# Patient Record
Sex: Male | Born: 1995 | Race: White | Hispanic: Yes | Marital: Single | Smoking: Never smoker
Health system: Southern US, Community
[De-identification: ages and names within clinical notes are randomized; demographics above are authoritative.]

## PROBLEM LIST (undated history)

## (undated) HISTORY — PX: KNEE SURGERY: SHX244

## (undated) HISTORY — PX: OTHER SURGICAL HISTORY: SHX169

---
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
## 2015-08-12 ENCOUNTER — Emergency Department (HOSPITAL_COMMUNITY): Payer: BLUE CROSS/BLUE SHIELD

## 2015-08-12 ENCOUNTER — Inpatient Hospital Stay (HOSPITAL_COMMUNITY): Payer: BLUE CROSS/BLUE SHIELD

## 2015-08-12 ENCOUNTER — Encounter (HOSPITAL_COMMUNITY): Payer: Self-pay | Admitting: Emergency Medicine

## 2015-08-12 ENCOUNTER — Inpatient Hospital Stay (HOSPITAL_COMMUNITY)
Admission: EM | Admit: 2015-08-12 | Discharge: 2015-08-19 | DRG: 988 | Disposition: A | Discharge status: Home / Self Care | Payer: BLUE CROSS/BLUE SHIELD | Attending: General Surgery | Admitting: General Surgery

## 2015-08-12 DIAGNOSIS — R509 Fever, unspecified: Secondary | ICD-10-CM | POA: Diagnosis not present

## 2015-08-12 DIAGNOSIS — R109 Unspecified abdominal pain: Secondary | ICD-10-CM | POA: Diagnosis present

## 2015-08-12 DIAGNOSIS — S36113A Laceration of liver, unspecified degree, initial encounter: Secondary | ICD-10-CM | POA: Diagnosis present

## 2015-08-12 DIAGNOSIS — D62 Acute posthemorrhagic anemia: Secondary | ICD-10-CM | POA: Diagnosis not present

## 2015-08-12 DIAGNOSIS — J90 Pleural effusion, not elsewhere classified: Secondary | ICD-10-CM

## 2015-08-12 DIAGNOSIS — S31109A Unspecified open wound of abdominal wall, unspecified quadrant without penetration into peritoneal cavity, initial encounter: Secondary | ICD-10-CM

## 2015-08-12 DIAGNOSIS — R079 Chest pain, unspecified: Secondary | ICD-10-CM

## 2015-08-12 DIAGNOSIS — S36116A Major laceration of liver, initial encounter: Secondary | ICD-10-CM

## 2015-08-12 LAB — TYPE AND SCREEN
ABO/RH(D): B POS
Antibody Screen: NEGATIVE

## 2015-08-12 LAB — I-STAT TROPONIN, ED: TROPONIN I, POC: 0 ng/mL (ref 0.00–0.08)

## 2015-08-12 LAB — BASIC METABOLIC PANEL
Anion gap: 11 (ref 5–15)
BUN: 12 mg/dL (ref 6–20)
CALCIUM: 8.7 mg/dL — AB (ref 8.9–10.3)
CO2: 25 mmol/L (ref 22–32)
CREATININE: 1.11 mg/dL (ref 0.61–1.24)
Chloride: 94 mmol/L — ABNORMAL LOW (ref 101–111)
GFR calc Af Amer: >60 mL/min (ref >60)
GLUCOSE: 146 mg/dL — AB (ref 65–99)
Potassium: 3.8 mmol/L (ref 3.5–5.1)
Sodium: 130 mmol/L — ABNORMAL LOW (ref 135–145)

## 2015-08-12 LAB — CBC
HCT: 32.9 % — ABNORMAL LOW (ref 39.0–52.0)
HEMATOCRIT: 28.8 % — AB (ref 39.0–52.0)
HEMOGLOBIN: 10.1 g/dL — AB (ref 13.0–17.0)
Hemoglobin: 11.7 g/dL — ABNORMAL LOW (ref 13.0–17.0)
MCH: 32.3 pg (ref 26.0–34.0)
MCH: 32.1 pg (ref 26.0–34.0)
MCHC: 35.6 g/dL (ref 30.0–36.0)
MCHC: 35.1 g/dL (ref 30.0–36.0)
MCV: 90.9 fL (ref 78.0–100.0)
MCV: 91.4 fL (ref 78.0–100.0)
Platelets: 198 10*3/uL (ref 150–400)
Platelets: 162 10*3/uL (ref 150–400)
RBC: 3.62 MIL/uL — ABNORMAL LOW (ref 4.22–5.81)
RBC: 3.15 MIL/uL — AB (ref 4.22–5.81)
RDW: 12.6 % (ref 11.5–15.5)
RDW: 12.7 % (ref 11.5–15.5)
WBC: 12.1 10*3/uL — ABNORMAL HIGH (ref 4.0–10.5)
WBC: 10.1 10*3/uL (ref 4.0–10.5)

## 2015-08-12 LAB — HEMOGLOBIN AND HEMATOCRIT, BLOOD
HCT: 28.1 % — ABNORMAL LOW (ref 39.0–52.0)
HCT: 28.3 % — ABNORMAL LOW (ref 39.0–52.0)
HCT: 27.5 % — ABNORMAL LOW (ref 39.0–52.0)
Hemoglobin: 10 g/dL — ABNORMAL LOW (ref 13.0–17.0)
Hemoglobin: 10 g/dL — ABNORMAL LOW (ref 13.0–17.0)
Hemoglobin: 9.6 g/dL — ABNORMAL LOW (ref 13.0–17.0)

## 2015-08-12 LAB — MRSA PCR SCREENING: MRSA BY PCR: NEGATIVE

## 2015-08-12 LAB — ABO/RH: ABO/RH(D): B POS

## 2015-08-12 LAB — D-DIMER, QUANTITATIVE: D-Dimer, Quant: 3.81 ug/mL-FEU — ABNORMAL HIGH (ref 0.00–0.48)

## 2015-08-12 MED ORDER — MIDAZOLAM HCL 2 MG/2ML IJ SOLN
INTRAMUSCULAR | Status: AC
  Filled 2015-08-12: qty 6

## 2015-08-12 MED ORDER — IOHEXOL 350 MG/ML SOLN
100.0000 mL | Freq: Once | INTRAVENOUS | Status: AC | PRN
  Administered 2015-08-12: 100 mL via INTRAVENOUS

## 2015-08-12 MED ORDER — FENTANYL CITRATE (PF) 100 MCG/2ML IJ SOLN
INTRAMUSCULAR | Status: AC
  Filled 2015-08-12: qty 6

## 2015-08-12 MED ORDER — OXYCODONE HCL 5 MG PO TABS: 5.0000 mg | ORAL_TABLET | ORAL | Status: DC | PRN

## 2015-08-12 MED ORDER — ALUM & MAG HYDROXIDE-SIMETH 200-200-20 MG/5ML PO SUSP
30.0000 mL | Freq: Four times a day (QID) | ORAL | Status: DC | PRN
  Administered 2015-08-12 – 2015-08-13 (×2): 30 mL via ORAL
  Filled 2015-08-12 (×2): qty 30

## 2015-08-12 MED ORDER — MIDAZOLAM HCL 2 MG/2ML IJ SOLN
INTRAMUSCULAR | Status: AC | PRN
  Administered 2015-08-12: 1 mg via INTRAVENOUS
  Administered 2015-08-12 (×3): 0.5 mg via INTRAVENOUS
  Administered 2015-08-12: 1 mg via INTRAVENOUS

## 2015-08-12 MED ORDER — MORPHINE SULFATE (PF) 4 MG/ML IV SOLN
4.0000 mg | Freq: Once | INTRAVENOUS | Status: AC
  Administered 2015-08-12: 4 mg via INTRAVENOUS
  Filled 2015-08-12: qty 1

## 2015-08-12 MED ORDER — DEXTROSE-NACL 5-0.45 % IV SOLN
INTRAVENOUS | Status: DC
  Administered 2015-08-12 – 2015-08-15 (×7): via INTRAVENOUS
  Administered 2015-08-16: 1 mL via INTRAVENOUS

## 2015-08-12 MED ORDER — ONDANSETRON HCL 4 MG/2ML IJ SOLN: 4.0000 mg | Freq: Four times a day (QID) | INTRAMUSCULAR | Status: DC | PRN

## 2015-08-12 MED ORDER — FENTANYL CITRATE (PF) 100 MCG/2ML IJ SOLN
INTRAMUSCULAR | Status: AC | PRN
  Administered 2015-08-12: 50 ug via INTRAVENOUS
  Administered 2015-08-12 (×5): 25 ug via INTRAVENOUS

## 2015-08-12 MED ORDER — LIDOCAINE HCL 1 % IJ SOLN
INTRAMUSCULAR | Status: AC
Start: 2015-08-12 — End: 2015-08-12
  Filled 2015-08-12: qty 20

## 2015-08-12 MED ORDER — ONDANSETRON HCL 4 MG PO TABS: 4.0000 mg | ORAL_TABLET | Freq: Four times a day (QID) | ORAL | Status: DC | PRN

## 2015-08-12 MED ORDER — IOHEXOL 300 MG/ML  SOLN
250.0000 mL | Freq: Once | INTRAMUSCULAR | Status: DC | PRN
  Administered 2015-08-12: 120 mL via INTRA_ARTERIAL
  Filled 2015-08-12: qty 250

## 2015-08-12 MED ORDER — MORPHINE SULFATE (PF) 2 MG/ML IV SOLN
2.0000 mg | INTRAVENOUS | Status: DC | PRN
  Administered 2015-08-12 – 2015-08-15 (×21): 2 mg via INTRAVENOUS
  Filled 2015-08-12 (×22): qty 1

## 2015-08-12 MED ORDER — SODIUM CHLORIDE 0.9 % IV BOLUS (SEPSIS)
1000.0000 mL | Freq: Once | INTRAVENOUS | Status: AC
  Administered 2015-08-12: 1000 mL via INTRAVENOUS

## 2015-08-12 MED ORDER — ONDANSETRON 4 MG PO TBDP
8.0000 mg | ORAL_TABLET | Freq: Once | ORAL | Status: AC
  Administered 2015-08-12: 8 mg via ORAL
  Filled 2015-08-12: qty 2

## 2015-08-12 NOTE — ED Notes (Signed)
Patient requesting pain medication prior to CT scan. Jay Bush in CT advised they would be coming soon to take patient for his scan.
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
## 2015-08-12 NOTE — ED Notes (Addendum)
PA Advised patient needs to remain in ED to go to IR prior to going to assigned bed upstairs. Also advised he had patient sign his consent form already.

## 2015-08-12 NOTE — Progress Notes (Addendum)
Upon raising HOB 30 degrees to use urinal, rt groin began to bleed. Hematoma palpable. Pressure held from 6:00 to 6:15, sand bag then applied. Pt now to remain flat until 22:15. Site of hematoma marked. Discussed with Watts in Radiology. Hemostasis occurred after 15 min. Groin no longer bleeding, but level 2 due to hematoma.

## 2015-08-12 NOTE — ED Notes (Signed)
Patient was given a mouth swab.
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
## 2015-08-12 NOTE — Procedures (Signed)
Post coil embolization of 2 branch vessels of the right hepatic artery supplying segments 7 and 8 of the right lobe of the liver .   No immediate post procedural complications.   Keep right leg straight for 4 hrs.    SignedSimonne Come Pager: [phone] 08/12/2015, 2:14 PM
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
## 2015-08-12 NOTE — Progress Notes (Signed)
Patient ID: Jay Bush, male   DOB: 11-Oct-1996, 19 y.o.   MRN: [id number] CTA reviewed with Dr. Grace Isaac.Evidence of stretch injury to arterial branch with small extrav. IR will proceed with angioembolization. Appreciate their input. Change admit to ICU aftyerwards. I spoke with him and his mother. Violeta Gelinas, MD, MPH, FACS Trauma: [phone] General Surgery: [phone]

## 2015-08-13 LAB — COMPREHENSIVE METABOLIC PANEL
ALBUMIN: 2.9 g/dL — AB (ref 3.5–5.0)
ALT: 757 U/L — ABNORMAL HIGH (ref 17–63)
AST: 459 U/L — AB (ref 15–41)
Alkaline Phosphatase: 71 U/L (ref 38–126)
Anion gap: 8 (ref 5–15)
BILIRUBIN TOTAL: 1.3 mg/dL — AB (ref 0.3–1.2)
BUN: 8 mg/dL (ref 6–20)
CO2: 28 mmol/L (ref 22–32)
Calcium: 8.3 mg/dL — ABNORMAL LOW (ref 8.9–10.3)
Chloride: 94 mmol/L — ABNORMAL LOW (ref 101–111)
Creatinine, Ser: 0.77 mg/dL (ref 0.61–1.24)
GFR calc Af Amer: >60 mL/min (ref >60)
GFR calc non Af Amer: >60 mL/min (ref >60)
GLUCOSE: 121 mg/dL — AB (ref 65–99)
POTASSIUM: 4 mmol/L (ref 3.5–5.1)
SODIUM: 130 mmol/L — AB (ref 135–145)
TOTAL PROTEIN: 5.8 g/dL — AB (ref 6.5–8.1)

## 2015-08-13 LAB — CBC
HCT: 22.9 % — ABNORMAL LOW (ref 39.0–52.0)
HEMOGLOBIN: 8 g/dL — AB (ref 13.0–17.0)
MCH: 32.3 pg (ref 26.0–34.0)
MCHC: 34.9 g/dL (ref 30.0–36.0)
MCV: 92.3 fL (ref 78.0–100.0)
Platelets: 141 10*3/uL — ABNORMAL LOW (ref 150–400)
RBC: 2.48 MIL/uL — AB (ref 4.22–5.81)
RDW: 12.9 % (ref 11.5–15.5)
WBC: 9 10*3/uL (ref 4.0–10.5)

## 2015-08-13 LAB — HEMOGLOBIN AND HEMATOCRIT, BLOOD
HCT: 22.7 % — ABNORMAL LOW (ref 39.0–52.0)
HEMOGLOBIN: 8 g/dL — AB (ref 13.0–17.0)

## 2015-08-13 MED ORDER — ACETAMINOPHEN 325 MG PO TABS
650.0000 mg | ORAL_TABLET | ORAL | Status: DC | PRN
  Administered 2015-08-13 – 2015-08-18 (×9): 650 mg via ORAL
  Filled 2015-08-13 (×9): qty 2

## 2015-08-13 MED ORDER — WHITE PETROLATUM GEL
Status: AC
  Filled 2015-08-13: qty 1

## 2015-08-13 NOTE — Progress Notes (Signed)
Discussed CBC results with Dr. Sheliah Hatch. Pt to remain on bedrest, H&H q 12.

## 2015-08-13 NOTE — Progress Notes (Signed)
Called lab to ask about results on"stat' lab ordered at 7:15 AM. Results pending still. Spoke with [place].

## 2015-08-13 NOTE — Progress Notes (Signed)
Called again to check on blood draw spoke with Jay Bush, someone still "on the way"
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
## 2015-08-13 NOTE — Progress Notes (Signed)
Utilization review completed. Brodi Kari, RN, BSN. 

## 2015-08-13 NOTE — Progress Notes (Signed)
Called phlebotomy to check on stat  blood draw spoke with Minerva Areola, someone "on the way".

## 2015-08-14 LAB — COMPREHENSIVE METABOLIC PANEL
ALT: 584 U/L — AB (ref 17–63)
AST: 207 U/L — AB (ref 15–41)
Albumin: 2.9 g/dL — ABNORMAL LOW (ref 3.5–5.0)
Alkaline Phosphatase: 80 U/L (ref 38–126)
Anion gap: 8 (ref 5–15)
BUN: 8 mg/dL (ref 6–20)
CHLORIDE: 94 mmol/L — AB (ref 101–111)
CO2: 30 mmol/L (ref 22–32)
CREATININE: 0.81 mg/dL (ref 0.61–1.24)
Calcium: 8.2 mg/dL — ABNORMAL LOW (ref 8.9–10.3)
GFR calc Af Amer: >60 mL/min (ref >60)
Glucose, Bld: 114 mg/dL — ABNORMAL HIGH (ref 65–99)
Potassium: 3.7 mmol/L (ref 3.5–5.1)
SODIUM: 132 mmol/L — AB (ref 135–145)
Total Bilirubin: 1.4 mg/dL — ABNORMAL HIGH (ref 0.3–1.2)
Total Protein: 5.7 g/dL — ABNORMAL LOW (ref 6.5–8.1)

## 2015-08-14 LAB — URINALYSIS, ROUTINE W REFLEX MICROSCOPIC
Bilirubin Urine: NEGATIVE
GLUCOSE, UA: NEGATIVE mg/dL
HGB URINE DIPSTICK: NEGATIVE
Ketones, ur: NEGATIVE mg/dL
Leukocytes, UA: NEGATIVE
Nitrite: NEGATIVE
PH: 7.5 (ref 5.0–8.0)
Protein, ur: NEGATIVE mg/dL
SPECIFIC GRAVITY, URINE: 1.015 (ref 1.005–1.030)
Urobilinogen, UA: 2 mg/dL — ABNORMAL HIGH (ref 0.0–1.0)

## 2015-08-14 LAB — CBC
HCT: 22.9 % — ABNORMAL LOW (ref 39.0–52.0)
Hemoglobin: 8.2 g/dL — ABNORMAL LOW (ref 13.0–17.0)
MCH: 33.6 pg (ref 26.0–34.0)
MCHC: 35.8 g/dL (ref 30.0–36.0)
MCV: 93.9 fL (ref 78.0–100.0)
PLATELETS: 145 10*3/uL — AB (ref 150–400)
RBC: 2.44 MIL/uL — AB (ref 4.22–5.81)
RDW: 13 % (ref 11.5–15.5)
WBC: 8.3 10*3/uL (ref 4.0–10.5)

## 2015-08-14 MED ORDER — OXYCODONE HCL 5 MG PO TABS
10.0000 mg | ORAL_TABLET | ORAL | Status: DC | PRN
  Administered 2015-08-14 – 2015-08-16 (×9): 10 mg via ORAL
  Filled 2015-08-14 (×10): qty 2
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
## 2015-08-15 ENCOUNTER — Encounter (HOSPITAL_COMMUNITY): Payer: Self-pay

## 2015-08-15 LAB — CBC
HCT: 25.9 % — ABNORMAL LOW (ref 39.0–52.0)
HEMOGLOBIN: 8.9 g/dL — AB (ref 13.0–17.0)
MCH: 32.2 pg (ref 26.0–34.0)
MCHC: 34.4 g/dL (ref 30.0–36.0)
MCV: 93.8 fL (ref 78.0–100.0)
Platelets: 156 10*3/uL (ref 150–400)
RBC: 2.76 MIL/uL — AB (ref 4.22–5.81)
RDW: 13.2 % (ref 11.5–15.5)
WBC: 7.4 10*3/uL (ref 4.0–10.5)
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
## 2015-08-16 MED ORDER — OXYCODONE HCL 5 MG PO TABS
5.0000 mg | ORAL_TABLET | ORAL | Status: DC | PRN
  Administered 2015-08-16: 5 mg via ORAL
  Administered 2015-08-16: 10 mg via ORAL
  Administered 2015-08-16: 5 mg via ORAL
  Administered 2015-08-16: 10 mg via ORAL
  Administered 2015-08-18: 15 mg via ORAL
  Filled 2015-08-16: qty 2
  Filled 2015-08-16 (×2): qty 1
  Filled 2015-08-16: qty 2
  Filled 2015-08-16: qty 3

## 2015-08-16 MED ORDER — MORPHINE SULFATE (PF) 2 MG/ML IV SOLN: 2.0000 mg | INTRAVENOUS | Status: DC | PRN

## 2015-08-16 MED ORDER — DOCUSATE SODIUM 100 MG PO CAPS
100.0000 mg | ORAL_CAPSULE | Freq: Two times a day (BID) | ORAL | Status: DC
  Administered 2015-08-16 – 2015-08-18 (×5): 100 mg via ORAL
  Filled 2015-08-16 (×5): qty 1

## 2015-08-16 MED ORDER — POLYETHYLENE GLYCOL 3350 17 G PO PACK
17.0000 g | PACK | Freq: Every day | ORAL | Status: DC
  Administered 2015-08-16 – 2015-08-18 (×3): 17 g via ORAL
  Filled 2015-08-16 (×3): qty 1
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
## 2015-08-17 ENCOUNTER — Inpatient Hospital Stay (HOSPITAL_COMMUNITY): Payer: BLUE CROSS/BLUE SHIELD

## 2015-08-17 DIAGNOSIS — D62 Acute posthemorrhagic anemia: Secondary | ICD-10-CM | POA: Diagnosis present

## 2015-08-17 LAB — CBC
HCT: 22.5 % — ABNORMAL LOW (ref 39.0–52.0)
HEMATOCRIT: 21.3 % — AB (ref 39.0–52.0)
Hemoglobin: 7.1 g/dL — ABNORMAL LOW (ref 13.0–17.0)
Hemoglobin: 7.5 g/dL — ABNORMAL LOW (ref 13.0–17.0)
MCH: 31.6 pg (ref 26.0–34.0)
MCH: 31.6 pg (ref 26.0–34.0)
MCHC: 33.3 g/dL (ref 30.0–36.0)
MCHC: 33.3 g/dL (ref 30.0–36.0)
MCV: 94.7 fL (ref 78.0–100.0)
MCV: 94.9 fL (ref 78.0–100.0)
PLATELETS: 316 10*3/uL (ref 150–400)
Platelets: 280 10*3/uL (ref 150–400)
RBC: 2.25 MIL/uL — AB (ref 4.22–5.81)
RBC: 2.37 MIL/uL — ABNORMAL LOW (ref 4.22–5.81)
RDW: 13 % (ref 11.5–15.5)
RDW: 13.2 % (ref 11.5–15.5)
WBC: 7.3 10*3/uL (ref 4.0–10.5)
WBC: 6.4 10*3/uL (ref 4.0–10.5)

## 2015-08-17 LAB — HEPATIC FUNCTION PANEL
ALT: 310 U/L — AB (ref 17–63)
AST: 85 U/L — AB (ref 15–41)
Albumin: 2.9 g/dL — ABNORMAL LOW (ref 3.5–5.0)
Alkaline Phosphatase: 158 U/L — ABNORMAL HIGH (ref 38–126)
BILIRUBIN DIRECT: 0.3 mg/dL (ref 0.1–0.5)
BILIRUBIN TOTAL: 1.2 mg/dL (ref 0.3–1.2)
Indirect Bilirubin: 0.9 mg/dL (ref 0.3–0.9)
Total Protein: 6 g/dL — ABNORMAL LOW (ref 6.5–8.1)

## 2015-08-17 NOTE — Progress Notes (Signed)
Notified Dr. Janee Bush of patient's fevers 101.8 and 101.4 and hemoglobin drop from 8.9 to 7.1. Pt given  Tylenol and encoraged to perform incentive spirometry -gets up to 1750 on incentive spirometer.

## 2015-08-17 NOTE — Progress Notes (Signed)
Patient's temp 102.8 oral, Dr. Magnus Ivan on call notified, order to give tylenol. Will continue to monitor.
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
## 2015-08-18 LAB — CBC
HEMATOCRIT: 24 % — AB (ref 39.0–52.0)
HEMOGLOBIN: 8.2 g/dL — AB (ref 13.0–17.0)
MCH: 32.4 pg (ref 26.0–34.0)
MCHC: 34.2 g/dL (ref 30.0–36.0)
MCV: 94.9 fL (ref 78.0–100.0)
Platelets: 331 10*3/uL (ref 150–400)
RBC: 2.53 MIL/uL — AB (ref 4.22–5.81)
RDW: 13.2 % (ref 11.5–15.5)
WBC: 7.7 10*3/uL (ref 4.0–10.5)

## 2015-08-18 LAB — CULTURE, BLOOD (ROUTINE X 2)
Culture: NO GROWTH
Culture: NO GROWTH
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
## 2015-08-19 NOTE — Discharge Planning (Addendum)
AVS given to pt with rx, pt verbalizes understanding. Will dc to private car home with all personal belongings. Dc'd at 1030, ambulatory.
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
## 2015-08-19 NOTE — Discharge Instructions (Signed)
Liver Laceration A liver laceration is a tear or cut in the liver. The liver is the largest solid organ in the body and is involved in many important bodily functions. Sometimes, a liver laceration can be a very serious injury. It can cause a lot of bleeding. Surgery may be needed. Other times, a liver laceration may be minor. Bed rest may be all that is needed. Either way, treatment in a hospital is almost always required.  Liver lacerations are categorized in grades from 1 to 5. Low numbers identify lacerations that are less severe than those with high numbers.   Grade 1: This is a tear in the outer lining of the liver. It is less than  inch (1 cm) deep.   Grade 2: This is a tear that is about  inch to 1 inch (1 to 3 cm) deep. It is less than 4 inches (10 cm) long.   Grade 3: This is a tear that is slightly more than 1 inch (3 cm) deep.   Grades 4 and 5: These lacerations are very deep. They affect a large part of the liver.  CAUSES  A strong blow to the area around your liver (blunt trauma). Blunt trauma can tear the liver even though it does not break the skin.  An injury in which an object goes through the skin into the liver (penetrating injury). SIGNS AND SYMPTOMS The most common symptoms of liver laceration are:   A swollen and firm abdomen.   Pain in the abdomen.   Tenderness when pressing on the right side of the abdomen.  Other symptoms may include:   Bleeding from a penetrating wound.   Spitting up blood.   Bruises on the abdomen.   A fast heartbeat.   Taking quick breaths.   Feeling weak and dizzy.  DIAGNOSIS To determine if you have a liver laceration, your health care provider will perform a physical exam and ask about any injuries to the right side of the abdomen. Various tests may be ordered, such as:  Blood tests. Your blood may be tested every few hours. This will show if you are losing blood.   CT scan. This test is used to check for  laceration or bleeding.  Laparoscopy. This involves placing a small camera into the abdomen and looking directly at the surface of the liver. TREATMENT Treatment for a liver laceration will vary depending on how deep the cut is and on the amount of bleeding. Treatment options include:  Bed rest. The person is watched closely. Tests are done very often.   Blood transfusions. Blood is given from someone else. This replaces blood lost from the injury. A transfusion may need to be done several times.   Surgery. A procedure may be needed to open the abdomen. Then, special material might be packed around the laceration to help it heal, or the laceration might be repaired. HOME CARE INSTRUCTIONS  Only take over-the-counter or prescription medicines as directed by your health care provider. Take all prescribed medicines exactly as directed. Do not take any other medicines without first asking your health care provider.  Rest and limit your activity as directed by your health care provider. It may be several months before you can go back to your old routine. Do not participate in activities that involve physical contact or require extra energy until your health care provider approves.  Keep all follow-up appointments with your health care provider. You may need more blood tests or another CT  scan to make sure your injury is healing. SEEK MEDICAL CARE IF:  Your abdominal pain does not go away.   You feel more weak and tired than usual.  SEEK IMMEDIATE MEDICAL CARE IF:  Your abdominal pain gets worse.   You have a cut or incision on your skin that becomes red, swells, or leaks any fluid.   You feel dizzy or very weak.   You have trouble breathing.   You have a fever.  MAKE SURE YOU:  Understand these instructions.  Will watch your condition.  Will get help right away if you are not doing well or get worse.   This information is not intended to replace advice given to you by  your health care provider. Make sure you discuss any questions you have with your health care provider.   Document Released: 11/25/2010 Document Revised: 06/25/2013 Document Reviewed: 04/14/2013 Elsevier Interactive Patient Education [employer]
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
## 2023-11-18 ENCOUNTER — Encounter (HOSPITAL_COMMUNITY): Payer: Self-pay

## 2023-11-18 ENCOUNTER — Emergency Department (HOSPITAL_COMMUNITY): Payer: BC Managed Care – PPO

## 2023-11-18 ENCOUNTER — Inpatient Hospital Stay (HOSPITAL_COMMUNITY): Payer: BC Managed Care – PPO

## 2023-11-18 ENCOUNTER — Other Ambulatory Visit: Payer: Self-pay

## 2023-11-18 ENCOUNTER — Inpatient Hospital Stay (HOSPITAL_COMMUNITY)
Admission: EM | Admit: 2023-11-18 | Discharge: 2023-11-21 | DRG: 417 | Disposition: A | Discharge status: Home / Self Care | Payer: BC Managed Care – PPO | Attending: Internal Medicine | Admitting: Internal Medicine

## 2023-11-18 DIAGNOSIS — K8060 Calculus of gallbladder and bile duct with cholecystitis, unspecified, without obstruction: Secondary | ICD-10-CM | POA: Diagnosis present

## 2023-11-18 DIAGNOSIS — K219 Gastro-esophageal reflux disease without esophagitis: Secondary | ICD-10-CM | POA: Diagnosis present

## 2023-11-18 DIAGNOSIS — K3189 Other diseases of stomach and duodenum: Secondary | ICD-10-CM | POA: Diagnosis not present

## 2023-11-18 DIAGNOSIS — R109 Unspecified abdominal pain: Principal | ICD-10-CM

## 2023-11-18 DIAGNOSIS — K838 Other specified diseases of biliary tract: Secondary | ICD-10-CM | POA: Diagnosis not present

## 2023-11-18 DIAGNOSIS — K8050 Calculus of bile duct without cholangitis or cholecystitis without obstruction: Secondary | ICD-10-CM | POA: Diagnosis present

## 2023-11-18 DIAGNOSIS — R7401 Elevation of levels of liver transaminase levels: Secondary | ICD-10-CM | POA: Diagnosis present

## 2023-11-18 DIAGNOSIS — Z6830 Body mass index (BMI) 30.0-30.9, adult: Secondary | ICD-10-CM

## 2023-11-18 DIAGNOSIS — K2970 Gastritis, unspecified, without bleeding: Secondary | ICD-10-CM | POA: Diagnosis present

## 2023-11-18 DIAGNOSIS — E669 Obesity, unspecified: Secondary | ICD-10-CM | POA: Diagnosis present

## 2023-11-18 DIAGNOSIS — K859 Acute pancreatitis without necrosis or infection, unspecified: Secondary | ICD-10-CM | POA: Diagnosis present

## 2023-11-18 DIAGNOSIS — K259 Gastric ulcer, unspecified as acute or chronic, without hemorrhage or perforation: Secondary | ICD-10-CM | POA: Diagnosis present

## 2023-11-18 DIAGNOSIS — K8020 Calculus of gallbladder without cholecystitis without obstruction: Secondary | ICD-10-CM | POA: Diagnosis not present

## 2023-11-18 DIAGNOSIS — K2950 Unspecified chronic gastritis without bleeding: Secondary | ICD-10-CM | POA: Diagnosis not present

## 2023-11-18 DIAGNOSIS — R7989 Other specified abnormal findings of blood chemistry: Secondary | ICD-10-CM | POA: Diagnosis not present

## 2023-11-18 LAB — CBC WITH DIFFERENTIAL/PLATELET
Abs Immature Granulocytes: 0.02 10*3/uL (ref 0.00–0.07)
Basophils Absolute: 0.1 10*3/uL (ref 0.0–0.1)
Basophils Relative: 1 %
Eosinophils Absolute: 0.2 10*3/uL (ref 0.0–0.5)
Eosinophils Relative: 4 %
HCT: 47.4 % (ref 39.0–52.0)
Hemoglobin: 16.5 g/dL (ref 13.0–17.0)
Immature Granulocytes: 0 %
Lymphocytes Relative: 35 %
Lymphs Abs: 1.8 10*3/uL (ref 0.7–4.0)
MCH: 30.7 pg (ref 26.0–34.0)
MCHC: 34.8 g/dL (ref 30.0–36.0)
MCV: 88.3 fL (ref 80.0–100.0)
Monocytes Absolute: 0.4 10*3/uL (ref 0.1–1.0)
Monocytes Relative: 7 %
Neutro Abs: 2.7 10*3/uL (ref 1.7–7.7)
Neutrophils Relative %: 53 %
Platelets: 242 10*3/uL (ref 150–400)
RBC: 5.37 MIL/uL (ref 4.22–5.81)
RDW: 12.9 % (ref 11.5–15.5)
WBC: 5.1 10*3/uL (ref 4.0–10.5)
nRBC: 0 % (ref 0.0–0.2)

## 2023-11-18 LAB — COMPREHENSIVE METABOLIC PANEL
ALT: 771 U/L — ABNORMAL HIGH (ref 0–44)
AST: 302 U/L — ABNORMAL HIGH (ref 15–41)
Albumin: 4.4 g/dL (ref 3.5–5.0)
Alkaline Phosphatase: 142 U/L — ABNORMAL HIGH (ref 38–126)
Anion gap: 14 (ref 5–15)
BUN: 14 mg/dL (ref 6–20)
CO2: 23 mmol/L (ref 22–32)
Calcium: 9.3 mg/dL (ref 8.9–10.3)
Chloride: 101 mmol/L (ref 98–111)
Creatinine, Ser: 1.21 mg/dL (ref 0.61–1.24)
GFR, Estimated: >60 mL/min (ref >60)
Glucose, Bld: 104 mg/dL — ABNORMAL HIGH (ref 70–99)
Potassium: 4.2 mmol/L (ref 3.5–5.1)
Sodium: 138 mmol/L (ref 135–145)
Total Bilirubin: 2.3 mg/dL — ABNORMAL HIGH (ref 0.0–1.2)
Total Protein: 7.9 g/dL (ref 6.5–8.1)

## 2023-11-18 LAB — URINALYSIS, ROUTINE W REFLEX MICROSCOPIC
Bilirubin Urine: NEGATIVE
Glucose, UA: NEGATIVE mg/dL
Hgb urine dipstick: NEGATIVE
Ketones, ur: NEGATIVE mg/dL
Leukocytes,Ua: NEGATIVE
Nitrite: NEGATIVE
Protein, ur: NEGATIVE mg/dL
Specific Gravity, Urine: 1.028 (ref 1.005–1.030)
pH: 5 (ref 5.0–8.0)

## 2023-11-18 LAB — LIPASE, BLOOD: Lipase: 94 U/L — ABNORMAL HIGH (ref 11–51)

## 2023-11-18 MED ORDER — HYDRALAZINE HCL 20 MG/ML IJ SOLN: 5.0000 mg | INTRAMUSCULAR | Status: DC | PRN

## 2023-11-18 MED ORDER — ONDANSETRON HCL 4 MG/2ML IJ SOLN
4.0000 mg | Freq: Four times a day (QID) | INTRAMUSCULAR | Status: DC | PRN
  Administered 2023-11-20: 4 mg via INTRAVENOUS
  Filled 2023-11-18: qty 2

## 2023-11-18 MED ORDER — MORPHINE SULFATE (PF) 2 MG/ML IV SOLN
2.0000 mg | INTRAVENOUS | Status: DC | PRN
  Administered 2023-11-18 – 2023-11-20 (×5): 2 mg via INTRAVENOUS
  Filled 2023-11-18 (×5): qty 1

## 2023-11-18 MED ORDER — MORPHINE SULFATE (PF) 2 MG/ML IV SOLN: 2.0000 mg | INTRAVENOUS | Status: DC | PRN

## 2023-11-18 MED ORDER — DEXTROSE-SODIUM CHLORIDE 5-0.45 % IV SOLN
INTRAVENOUS | Status: DC
Start: 2023-11-18 — End: 2023-11-19

## 2023-11-18 MED ORDER — GADOBUTROL 1 MMOL/ML IV SOLN
10.0000 mL | Freq: Once | INTRAVENOUS | Status: AC | PRN
  Administered 2023-11-18: 10 mL via INTRAVENOUS

## 2023-11-18 MED ORDER — OXYCODONE HCL 5 MG PO TABS
5.0000 mg | ORAL_TABLET | ORAL | Status: DC | PRN
  Administered 2023-11-18 – 2023-11-21 (×8): 5 mg via ORAL
  Filled 2023-11-18 (×8): qty 1

## 2023-11-18 MED ORDER — ONDANSETRON HCL 4 MG PO TABS
4.0000 mg | ORAL_TABLET | Freq: Four times a day (QID) | ORAL | Status: DC | PRN
  Administered 2023-11-18 – 2023-11-20 (×2): 4 mg via ORAL
  Filled 2023-11-18 (×2): qty 1

## 2023-11-18 NOTE — Plan of Care (Signed)

## 2023-11-18 NOTE — ED Provider Notes (Signed)
 [place] [facility] Provider Note   CSN: [id number] Arrival date & time: 11/18/23  1447     History  Chief Complaint  Patient presents with   Abdominal Pain    Jay Bush is a 28 y.o. male.  28 year old male presents with almost 2-week history of right upper quadrant pain that goes to his epigastric area.  States that symptoms are worse after he eats.  No recent fever or chills.  No nausea or vomiting.  Denies any change in color of his urine.  Went to urgent care and was sent here for further evaluation.  Denies any prior history of gallbladder disease.  Does not have any prior medical history       Home Medications Prior to Admission medications   Medication Sig Start Date End Date Taking? Authorizing Provider  oxyCODONE -acetaminophen  (PERCOCET/ROXICET) 5-325 MG tablet Take 2 tablets by mouth every 6 (six) hours as needed for severe pain.    [provider]      Allergies    Patient has no known allergies.    Review of Systems   Review of Systems  All other systems reviewed and are negative.   Physical Exam Updated Vital Signs BP 120/88   Pulse 66   Temp 98.8 F (37.1 C) (Oral)   Resp 15   Ht 1.854 m (6' 1)   Wt 104.3 kg   SpO2 98%   BMI 30.34 kg/m  Physical Exam Vitals and nursing note reviewed.  Constitutional:      General: He is not in acute distress.    Appearance: Normal appearance. He is well-developed. He is not toxic-appearing.  HENT:     Head: Normocephalic and atraumatic.  Eyes:     General: Lids are normal.     Conjunctiva/sclera: Conjunctivae normal.     Pupils: Pupils are equal, round, and reactive to light.  Neck:     Thyroid: No thyroid mass.     Trachea: No tracheal deviation.  Cardiovascular:     Rate and Rhythm: Normal rate and regular rhythm.     Heart sounds: Normal heart sounds. No murmur heard.    No gallop.  Pulmonary:     Effort: Pulmonary effort is normal. No  respiratory distress.     Breath sounds: Normal breath sounds. No stridor. No decreased breath sounds, wheezing, rhonchi or rales.  Abdominal:     General: There is no distension.     Palpations: Abdomen is soft.     Tenderness: There is abdominal tenderness in the right upper quadrant and epigastric area. There is no rebound.    Musculoskeletal:        General: No tenderness. Normal range of motion.     Cervical back: Normal range of motion and neck supple.  Skin:    General: Skin is warm and dry.     Findings: No abrasion or rash.  Neurological:     Mental Status: He is alert and oriented to person, place, and time. Mental status is at baseline.     GCS: GCS eye subscore is 4. GCS verbal subscore is 5. GCS motor subscore is 6.     Cranial Nerves: No cranial nerve deficit.     Sensory: No sensory deficit.     Motor: Motor function is intact.  Psychiatric:        Attention and Perception: Attention normal.        Speech: Speech normal.  Behavior: Behavior normal.     ED Results / Procedures / Treatments   Labs (all labs ordered are listed, but only abnormal results are displayed) Labs Reviewed  COMPREHENSIVE METABOLIC PANEL - Abnormal; Notable for the following components:      Result Value   Glucose, Bld 104 (*)    AST 302 (*)    ALT 771 (*)    Alkaline Phosphatase 142 (*)    Total Bilirubin 2.3 (*)    All other components within normal limits  LIPASE, BLOOD - Abnormal; Notable for the following components:   Lipase 94 (*)    All other components within normal limits  URINALYSIS, ROUTINE W REFLEX MICROSCOPIC - Abnormal; Notable for the following components:   Color, Urine AMBER (*)    All other components within normal limits  CBC WITH DIFFERENTIAL/PLATELET    EKG None  Radiology US  Abdomen Limited RUQ (LIVER/GB) Result Date: 11/18/2023 CLINICAL DATA:  Right upper quadrant abdominal pain EXAM: ULTRASOUND ABDOMEN LIMITED RIGHT UPPER QUADRANT COMPARISON:  CT  abdomen 08/12/2015 FINDINGS: Gallbladder: Sludge in gallstones noted in the gallbladder. Gallstones measure up to 1.6 cm in diameter. No gallbladder wall thickening or sonographic Murphy sign. Common bile duct: Diameter: 0.9 cm, substantially increased compared to 08/12/2015 Liver: Poor visualization of the left hepatic lobe attributed to overlying bowel gas. No focal lesion identified. Within normal limits in parenchymal echogenicity. Portal vein is patent on color Doppler imaging with normal direction of blood flow towards the liver. Other: None. IMPRESSION: 1. Cholelithiasis and gallbladder sludge. No gallbladder wall thickening or sonographic Murphy sign. 2. The common bile duct is dilated to 0.9 cm, substantially increased compared to 08/12/2015. Correlate with liver function tests. If there is clinical concern for biliary obstruction, MRCP or ERCP could be performed. Electronically Signed   By: Ryan Salvage M.D.   On: 11/18/2023 16:17    Procedures Procedures    Medications Ordered in ED Medications - No data to display  ED Course/ Medical Decision Making/ A&P                                 Medical Decision Making Amount and/or Complexity of Data Reviewed Radiology: ordered.   Patient's liver function tests are significantly elevated.  Had ultrasound which showed possible common bile duct stone.  Discussed with gastroenterology, Dr shila, who recommends patient have MRCP admit to medicine service.  Will consult medicine team        Final Clinical Impression(s) / ED Diagnoses Final diagnoses:  None    Rx / DC Orders ED Discharge Orders     None         Dasie Faden, MD 11/18/23 1651

## 2023-11-18 NOTE — ED Triage Notes (Signed)
 Pt c.o mid to RUQ abd pain after eating since December. Most recent episode was Monday. Pt also having vomiting episodes. Sent here from [facility] for further eval.
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
## 2023-11-18 NOTE — ED Provider Triage Note (Signed)
 Emergency Medicine Provider Triage Evaluation Note  Jay Bush , a 28 y.o. male  was evaluated in triage.  Pt complains of RUQ pain.  Review of Systems  Positive:  Negative:   Physical Exam  BP 120/88   Pulse 66   Temp 98.8 F (37.1 C) (Oral)   Resp 15   Ht 6' 1 (1.854 m)   Wt 104.3 kg   SpO2 98%   BMI 30.34 kg/m  Gen:   Awake, no distress   Resp:  Normal effort  MSK:   Moves extremities without difficulty  Other:    Medical Decision Making  Medically screening exam initiated at 3:23 PM.  Appropriate orders placed.  Jay Bush was informed that the remainder of the evaluation will be completed by another provider, this initial triage assessment does not replace that evaluation, and the importance of remaining in the ED until their evaluation is complete.  RUQ pain intermittently since December. Also with intermittent vomiting. Last emesis Friday. Denies fevers, diarrhea, hematochezia.   Jay Bush, [place] 11/18/23 1525

## 2023-11-19 ENCOUNTER — Encounter (HOSPITAL_COMMUNITY)
Admission: EM | Disposition: A | Discharge status: Home / Self Care | Payer: Self-pay | Source: Home / Self Care | Attending: Internal Medicine

## 2023-11-19 ENCOUNTER — Inpatient Hospital Stay (HOSPITAL_COMMUNITY): Payer: BC Managed Care – PPO

## 2023-11-19 ENCOUNTER — Inpatient Hospital Stay (HOSPITAL_COMMUNITY): Payer: BC Managed Care – PPO | Admitting: Anesthesiology

## 2023-11-19 ENCOUNTER — Encounter (HOSPITAL_COMMUNITY): Payer: Self-pay | Admitting: Internal Medicine

## 2023-11-19 DIAGNOSIS — R109 Unspecified abdominal pain: Secondary | ICD-10-CM

## 2023-11-19 DIAGNOSIS — K838 Other specified diseases of biliary tract: Secondary | ICD-10-CM

## 2023-11-19 DIAGNOSIS — K219 Gastro-esophageal reflux disease without esophagitis: Secondary | ICD-10-CM | POA: Diagnosis not present

## 2023-11-19 DIAGNOSIS — K8050 Calculus of bile duct without cholangitis or cholecystitis without obstruction: Secondary | ICD-10-CM | POA: Diagnosis not present

## 2023-11-19 DIAGNOSIS — R7989 Other specified abnormal findings of blood chemistry: Secondary | ICD-10-CM | POA: Diagnosis not present

## 2023-11-19 DIAGNOSIS — K259 Gastric ulcer, unspecified as acute or chronic, without hemorrhage or perforation: Secondary | ICD-10-CM

## 2023-11-19 DIAGNOSIS — K8020 Calculus of gallbladder without cholecystitis without obstruction: Secondary | ICD-10-CM

## 2023-11-19 DIAGNOSIS — K2950 Unspecified chronic gastritis without bleeding: Secondary | ICD-10-CM | POA: Diagnosis not present

## 2023-11-19 DIAGNOSIS — K3189 Other diseases of stomach and duodenum: Secondary | ICD-10-CM

## 2023-11-19 HISTORY — PX: SPHINCTEROTOMY: SHX5544

## 2023-11-19 HISTORY — PX: BIOPSY: SHX5522

## 2023-11-19 HISTORY — PX: REMOVAL OF STONES: SHX5545

## 2023-11-19 HISTORY — PX: ENDOSCOPIC RETROGRADE CHOLANGIOPANCREATOGRAPHY (ERCP) WITH PROPOFOL: SHX5810

## 2023-11-19 LAB — HEPATITIS PANEL, ACUTE
HCV Ab: NONREACTIVE
Hep A IgM: NONREACTIVE
Hep B C IgM: NONREACTIVE
Hepatitis B Surface Ag: NONREACTIVE

## 2023-11-19 LAB — CBC
HCT: 45.7 % (ref 39.0–52.0)
Hemoglobin: 16.2 g/dL (ref 13.0–17.0)
MCH: 30.7 pg (ref 26.0–34.0)
MCHC: 35.4 g/dL (ref 30.0–36.0)
MCV: 86.7 fL (ref 80.0–100.0)
Platelets: 209 10*3/uL (ref 150–400)
RBC: 5.27 MIL/uL (ref 4.22–5.81)
RDW: 12.9 % (ref 11.5–15.5)
WBC: 9.2 10*3/uL (ref 4.0–10.5)
nRBC: 0 % (ref 0.0–0.2)

## 2023-11-19 LAB — BASIC METABOLIC PANEL
Anion gap: 9 (ref 5–15)
BUN: 13 mg/dL (ref 6–20)
CO2: 27 mmol/L (ref 22–32)
Calcium: 9 mg/dL (ref 8.9–10.3)
Chloride: 100 mmol/L (ref 98–111)
Creatinine, Ser: 1.25 mg/dL — ABNORMAL HIGH (ref 0.61–1.24)
GFR, Estimated: >60 mL/min (ref >60)
Glucose, Bld: 116 mg/dL — ABNORMAL HIGH (ref 70–99)
Potassium: 3.6 mmol/L (ref 3.5–5.1)
Sodium: 136 mmol/L (ref 135–145)

## 2023-11-19 LAB — SURGICAL PCR SCREEN

## 2023-11-19 LAB — MRSA NEXT GEN BY PCR, NASAL: MRSA by PCR Next Gen: NOT DETECTED

## 2023-11-19 LAB — HEPATIC FUNCTION PANEL
ALT: 593 U/L — ABNORMAL HIGH (ref 0–44)
AST: 161 U/L — ABNORMAL HIGH (ref 15–41)
Albumin: 4.1 g/dL (ref 3.5–5.0)
Alkaline Phosphatase: 128 U/L — ABNORMAL HIGH (ref 38–126)
Bilirubin, Direct: 0.4 mg/dL — ABNORMAL HIGH (ref 0.0–0.2)
Indirect Bilirubin: 1.5 mg/dL — ABNORMAL HIGH (ref 0.3–0.9)
Total Bilirubin: 1.9 mg/dL — ABNORMAL HIGH (ref 0.0–1.2)
Total Protein: 7.3 g/dL (ref 6.5–8.1)

## 2023-11-19 LAB — LIPASE, BLOOD: Lipase: 1750 U/L — ABNORMAL HIGH (ref 11–51)

## 2023-11-19 LAB — HIV ANTIBODY (ROUTINE TESTING W REFLEX): HIV Screen 4th Generation wRfx: NONREACTIVE

## 2023-11-19 SURGERY — ENDOSCOPIC RETROGRADE CHOLANGIOPANCREATOGRAPHY (ERCP) WITH PROPOFOL
Anesthesia: General

## 2023-11-19 MED ORDER — PROPOFOL 10 MG/ML IV BOLUS
INTRAVENOUS | Status: DC | PRN
  Administered 2023-11-19: 200 mg via INTRAVENOUS

## 2023-11-19 MED ORDER — DEXTROSE-SODIUM CHLORIDE 5-0.45 % IV SOLN: INTRAVENOUS | Status: AC

## 2023-11-19 MED ORDER — GLUCAGON HCL RDNA (DIAGNOSTIC) 1 MG IJ SOLR
INTRAMUSCULAR | Status: AC
  Filled 2023-11-19: qty 1

## 2023-11-19 MED ORDER — DICLOFENAC SUPPOSITORY 100 MG
RECTAL | Status: AC
  Filled 2023-11-19: qty 1

## 2023-11-19 MED ORDER — AMPICILLIN-SULBACTAM SODIUM 1.5 (1-0.5) G IJ SOLR
1.5000 g | Freq: Once | INTRAMUSCULAR | Status: AC
  Administered 2023-11-19: 1.5 g via INTRAVENOUS
  Filled 2023-11-19: qty 4

## 2023-11-19 MED ORDER — INDOMETHACIN 50 MG RE SUPP
RECTAL | Status: AC
  Filled 2023-11-19: qty 2

## 2023-11-19 MED ORDER — INDOMETHACIN 50 MG RE SUPP: RECTAL | Status: DC | PRN

## 2023-11-19 MED ORDER — FENTANYL CITRATE (PF) 250 MCG/5ML IJ SOLN
INTRAMUSCULAR | Status: DC | PRN
  Administered 2023-11-19: 100 ug via INTRAVENOUS

## 2023-11-19 MED ORDER — INDOMETHACIN 50 MG RE SUPP: 100.0000 mg | Freq: Once | RECTAL | Status: DC

## 2023-11-19 MED ORDER — LIDOCAINE 2% (20 MG/ML) 5 ML SYRINGE
INTRAMUSCULAR | Status: DC | PRN
  Administered 2023-11-19: 100 mg via INTRAVENOUS

## 2023-11-19 MED ORDER — PANTOPRAZOLE SODIUM 40 MG PO TBEC
40.0000 mg | DELAYED_RELEASE_TABLET | Freq: Every day | ORAL | Status: DC
  Administered 2023-11-19 – 2023-11-21 (×3): 40 mg via ORAL
  Filled 2023-11-19 (×3): qty 1

## 2023-11-19 MED ORDER — SODIUM CHLORIDE 0.9 % IV SOLN
INTRAVENOUS | Status: DC | PRN
  Administered 2023-11-19: 25 mL

## 2023-11-19 MED ORDER — FENTANYL CITRATE (PF) 100 MCG/2ML IJ SOLN
INTRAMUSCULAR | Status: AC
  Filled 2023-11-19: qty 2

## 2023-11-19 MED ORDER — LACTATED RINGERS IV SOLN: INTRAVENOUS | Status: DC | PRN

## 2023-11-19 MED ORDER — GLUCAGON HCL RDNA (DIAGNOSTIC) 1 MG IJ SOLR
INTRAMUSCULAR | Status: DC | PRN
  Administered 2023-11-19: .25 mg via INTRAVENOUS

## 2023-11-19 MED ORDER — ROCURONIUM BROMIDE 10 MG/ML (PF) SYRINGE
PREFILLED_SYRINGE | INTRAVENOUS | Status: DC | PRN
  Administered 2023-11-19: 70 mg via INTRAVENOUS

## 2023-11-19 MED ORDER — DICLOFENAC SUPPOSITORY 100 MG
RECTAL | Status: DC | PRN
  Administered 2023-11-19: 100 mg via RECTAL

## 2023-11-19 MED ORDER — ONDANSETRON HCL 4 MG/2ML IJ SOLN
INTRAMUSCULAR | Status: DC | PRN
  Administered 2023-11-19: 4 mg via INTRAVENOUS

## 2023-11-19 MED ORDER — DEXAMETHASONE SODIUM PHOSPHATE 10 MG/ML IJ SOLN
INTRAMUSCULAR | Status: DC | PRN
  Administered 2023-11-19: 10 mg via INTRAVENOUS

## 2023-11-19 MED ORDER — SUGAMMADEX SODIUM 200 MG/2ML IV SOLN
INTRAVENOUS | Status: DC | PRN
  Administered 2023-11-19: 300 mg via INTRAVENOUS
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
## 2023-11-19 NOTE — Transfer of Care (Signed)
 Immediate Anesthesia Transfer of Care Note  Patient: Jay Bush  Procedure(s) Performed: ENDOSCOPIC RETROGRADE CHOLANGIOPANCREATOGRAPHY (ERCP) WITH PROPOFOL   Patient Location: PACU  Anesthesia Type:General  Level of Consciousness: awake, alert , and oriented  Airway & Oxygen Therapy: Patient Spontanous Breathing  Post-op Assessment: Report given to RN and Post -op Vital signs reviewed and stable  Post vital signs: Reviewed and stable  Last Vitals:  Vitals Value Taken Time  BP 123/71 11/19/23 1501  Temp    Pulse 65 11/19/23 1503  Resp 20 11/19/23 1503  SpO2 93 % 11/19/23 1503  Vitals shown include unfiled device data.  Last Pain:  Vitals:   11/19/23 1220  TempSrc: Temporal  PainSc: 0-No pain      Patients Stated Pain Goal: 0 (11/19/23 0315)  Complications: There were no known notable events for this encounter.
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
## 2023-11-19 NOTE — Anesthesia Postprocedure Evaluation (Signed)
 Anesthesia Post Note  Patient: KESHUN BERRETT  Procedure(s) Performed: ENDOSCOPIC RETROGRADE CHOLANGIOPANCREATOGRAPHY (ERCP) WITH PROPOFOL  BIOPSY SPHINCTEROTOMY REMOVAL OF SLUDGE     Patient location during evaluation: PACU Anesthesia Type: General Level of consciousness: awake and alert Pain management: pain level controlled Vital Signs Assessment: post-procedure vital signs reviewed and stable Respiratory status: spontaneous breathing, nonlabored ventilation and respiratory function stable Cardiovascular status: blood pressure returned to baseline and stable Postop Assessment: no apparent nausea or vomiting Anesthetic complications: no   There were no known notable events for this encounter.  Last Vitals:  Vitals:   11/19/23 1530 11/19/23 1547  BP: 116/82 111/73  Pulse: 68 67  Resp: 13   Temp:  36.6 C  SpO2: 94% 97%    Last Pain:  Vitals:   11/19/23 1547  TempSrc: Oral  PainSc: 0-No pain                 Butler Levander Pinal
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
## 2023-11-19 NOTE — Plan of Care (Signed)
  Problem: Clinical Measurements: Goal: Ability to maintain clinical measurements within normal limits will improve Outcome: Progressing   Problem: Nutrition: Goal: Adequate nutrition will be maintained Outcome: Progressing   Problem: Pain Management: Goal: General experience of comfort will improve Outcome: Progressing

## 2023-11-19 NOTE — Anesthesia Preprocedure Evaluation (Signed)
 Anesthesia Evaluation  Patient identified by MRN, date of birth, ID band Patient awake    Reviewed: Allergy & Precautions, H&P , NPO status , Patient's Chart, lab work & pertinent test results  Airway Mallampati: II  TM Distance: >3 FB Neck ROM: Full    Dental no notable dental hx.    Pulmonary neg pulmonary ROS   Pulmonary exam normal breath sounds clear to auscultation       Cardiovascular negative cardio ROS Normal cardiovascular exam Rhythm:Regular Rate:Normal     Neuro/Psych negative neurological ROS  negative psych ROS   GI/Hepatic negative GI ROS, Neg liver ROS,,,  Endo/Other  negative endocrine ROS    Renal/GU negative Renal ROS  negative genitourinary   Musculoskeletal negative musculoskeletal ROS (+)    Abdominal  (+) + obese  Peds negative pediatric ROS (+)  Hematology negative hematology ROS (+)   Anesthesia Other Findings   Reproductive/Obstetrics negative OB ROS                             Anesthesia Physical Anesthesia Plan  ASA: 2  Anesthesia Plan: General   Post-op Pain Management:    Induction: Intravenous  PONV Risk Score and Plan: 2 and Ondansetron , Midazolam  and Treatment may vary due to age or medical condition  Airway Management Planned: Oral ETT  Additional Equipment:   Intra-op Plan:   Post-operative Plan: Extubation in OR  Informed Consent: I have reviewed the patients History and Physical, chart, labs and discussed the procedure including the risks, benefits and alternatives for the proposed anesthesia with the patient or authorized representative who has indicated his/her understanding and acceptance.     Dental advisory given  Plan Discussed with: CRNA  Anesthesia Plan Comments:        Anesthesia Quick Evaluation

## 2023-11-19 NOTE — Anesthesia Procedure Notes (Signed)
 Procedure Name: Intubation Date/Time: 11/19/2023 2:05 PM  Performed by: Christopher Comings, CRNAPre-anesthesia Checklist: Patient identified, Emergency Drugs available, Suction available and Patient being monitored Patient Re-evaluated:Patient Re-evaluated prior to induction Oxygen Delivery Method: Circle system utilized Preoxygenation: Pre-oxygenation with 100% oxygen Induction Type: IV induction Ventilation: Mask ventilation without difficulty Laryngoscope Size: Mac and 4 Grade View: Grade I Tube type: Oral Tube size: 7.5 mm Number of attempts: 1 Airway Equipment and Method: Stylet and Oral airway Placement Confirmation: ETT inserted through vocal cords under direct vision, positive ETCO2 and breath sounds checked- equal and bilateral Secured at: 22 cm Tube secured with: Tape Dental Injury: Teeth and Oropharynx as per pre-operative assessment

## 2023-11-19 NOTE — Plan of Care (Signed)

## 2023-11-20 ENCOUNTER — Encounter (HOSPITAL_COMMUNITY): Payer: Self-pay | Admitting: Internal Medicine

## 2023-11-20 ENCOUNTER — Inpatient Hospital Stay (HOSPITAL_COMMUNITY): Payer: BC Managed Care – PPO | Admitting: Certified Registered Nurse Anesthetist

## 2023-11-20 ENCOUNTER — Encounter (HOSPITAL_COMMUNITY)
Admission: EM | Disposition: A | Discharge status: Home / Self Care | Payer: Self-pay | Source: Home / Self Care | Attending: Internal Medicine

## 2023-11-20 DIAGNOSIS — K8050 Calculus of bile duct without cholangitis or cholecystitis without obstruction: Secondary | ICD-10-CM | POA: Diagnosis not present

## 2023-11-20 DIAGNOSIS — R7401 Elevation of levels of liver transaminase levels: Secondary | ICD-10-CM | POA: Diagnosis not present

## 2023-11-20 HISTORY — PX: CHOLECYSTECTOMY: SHX55

## 2023-11-20 LAB — CBC
HCT: 39.7 % (ref 39.0–52.0)
Hemoglobin: 14.2 g/dL (ref 13.0–17.0)
MCH: 31.1 pg (ref 26.0–34.0)
MCHC: 35.8 g/dL (ref 30.0–36.0)
MCV: 86.9 fL (ref 80.0–100.0)
Platelets: 206 10*3/uL (ref 150–400)
RBC: 4.57 MIL/uL (ref 4.22–5.81)
RDW: 12.5 % (ref 11.5–15.5)
WBC: 10.5 10*3/uL (ref 4.0–10.5)
nRBC: 0 % (ref 0.0–0.2)

## 2023-11-20 LAB — BASIC METABOLIC PANEL
Anion gap: 8 (ref 5–15)
BUN: 14 mg/dL (ref 6–20)
CO2: 26 mmol/L (ref 22–32)
Calcium: 8.6 mg/dL — ABNORMAL LOW (ref 8.9–10.3)
Chloride: 101 mmol/L (ref 98–111)
Creatinine, Ser: 1.16 mg/dL (ref 0.61–1.24)
GFR, Estimated: >60 mL/min (ref >60)
Glucose, Bld: 133 mg/dL — ABNORMAL HIGH (ref 70–99)
Potassium: 3.7 mmol/L (ref 3.5–5.1)
Sodium: 135 mmol/L (ref 135–145)

## 2023-11-20 LAB — HEPATIC FUNCTION PANEL
ALT: 340 U/L — ABNORMAL HIGH (ref 0–44)
AST: 55 U/L — ABNORMAL HIGH (ref 15–41)
Albumin: 3.6 g/dL (ref 3.5–5.0)
Alkaline Phosphatase: 101 U/L (ref 38–126)
Bilirubin, Direct: 0.3 mg/dL — ABNORMAL HIGH (ref 0.0–0.2)
Indirect Bilirubin: 1.1 mg/dL — ABNORMAL HIGH (ref 0.3–0.9)
Total Bilirubin: 1.4 mg/dL — ABNORMAL HIGH (ref 0.0–1.2)
Total Protein: 6.6 g/dL (ref 6.5–8.1)

## 2023-11-20 SURGERY — LAPAROSCOPIC CHOLECYSTECTOMY
Anesthesia: General

## 2023-11-20 MED ORDER — HYDROMORPHONE HCL 1 MG/ML IJ SOLN
INTRAMUSCULAR | Status: AC
  Filled 2023-11-20: qty 1

## 2023-11-20 MED ORDER — CEFAZOLIN SODIUM-DEXTROSE 2-4 GM/100ML-% IV SOLN
2.0000 g | Freq: Once | INTRAVENOUS | Status: AC
  Administered 2023-11-20: 2 g via INTRAVENOUS
  Filled 2023-11-20: qty 100

## 2023-11-20 MED ORDER — SODIUM CHLORIDE 0.9 % IV SOLN: 12.5000 mg | INTRAVENOUS | Status: DC | PRN

## 2023-11-20 MED ORDER — ROCURONIUM BROMIDE 10 MG/ML (PF) SYRINGE
PREFILLED_SYRINGE | INTRAVENOUS | Status: DC | PRN
  Administered 2023-11-20: 70 mg via INTRAVENOUS
  Administered 2023-11-20: 10 mg via INTRAVENOUS

## 2023-11-20 MED ORDER — MIDAZOLAM HCL 5 MG/5ML IJ SOLN
INTRAMUSCULAR | Status: DC | PRN
  Administered 2023-11-20: 2 mg via INTRAVENOUS

## 2023-11-20 MED ORDER — LIDOCAINE 2% (20 MG/ML) 5 ML SYRINGE
INTRAMUSCULAR | Status: DC | PRN
  Administered 2023-11-20: 100 mg via INTRAVENOUS

## 2023-11-20 MED ORDER — PROPOFOL 10 MG/ML IV BOLUS
INTRAVENOUS | Status: DC | PRN
  Administered 2023-11-20: 200 mg via INTRAVENOUS

## 2023-11-20 MED ORDER — CHLORHEXIDINE GLUCONATE 0.12 % MT SOLN
OROMUCOSAL | Status: AC
  Administered 2023-11-20: 15 mL via OROMUCOSAL
  Filled 2023-11-20: qty 15

## 2023-11-20 MED ORDER — 0.9 % SODIUM CHLORIDE (POUR BTL) OPTIME
TOPICAL | Status: DC | PRN
  Administered 2023-11-20: 1000 mL

## 2023-11-20 MED ORDER — DEXMEDETOMIDINE HCL IN NACL 80 MCG/20ML IV SOLN
INTRAVENOUS | Status: DC | PRN
  Administered 2023-11-20: 12 ug via INTRAVENOUS

## 2023-11-20 MED ORDER — SUGAMMADEX SODIUM 200 MG/2ML IV SOLN
INTRAVENOUS | Status: DC | PRN
  Administered 2023-11-20: 200 mg via INTRAVENOUS

## 2023-11-20 MED ORDER — HYDROMORPHONE HCL 1 MG/ML IJ SOLN
0.2500 mg | INTRAMUSCULAR | Status: DC | PRN
  Administered 2023-11-20 (×2): 0.5 mg via INTRAVENOUS

## 2023-11-20 MED ORDER — AMISULPRIDE (ANTIEMETIC) 5 MG/2ML IV SOLN: 10.0000 mg | Freq: Once | INTRAVENOUS | Status: DC | PRN

## 2023-11-20 MED ORDER — LABETALOL HCL 5 MG/ML IV SOLN
INTRAVENOUS | Status: DC | PRN
  Administered 2023-11-20 (×3): 5 mg via INTRAVENOUS

## 2023-11-20 MED ORDER — BUPIVACAINE-EPINEPHRINE 0.25% -1:200000 IJ SOLN
INTRAMUSCULAR | Status: DC | PRN
  Administered 2023-11-20: 19 mL

## 2023-11-20 MED ORDER — DEXAMETHASONE SODIUM PHOSPHATE 10 MG/ML IJ SOLN
INTRAMUSCULAR | Status: AC
Start: 2023-11-20 — End: ?
  Filled 2023-11-20: qty 1

## 2023-11-20 MED ORDER — LABETALOL HCL 5 MG/ML IV SOLN
INTRAVENOUS | Status: AC
  Filled 2023-11-20: qty 4

## 2023-11-20 MED ORDER — DEXAMETHASONE SODIUM PHOSPHATE 10 MG/ML IJ SOLN
INTRAMUSCULAR | Status: DC | PRN
  Administered 2023-11-20: 10 mg via INTRAVENOUS

## 2023-11-20 MED ORDER — PROPOFOL 10 MG/ML IV BOLUS
INTRAVENOUS | Status: AC
  Filled 2023-11-20: qty 20

## 2023-11-20 MED ORDER — OXYCODONE HCL 5 MG PO TABS
5.0000 mg | ORAL_TABLET | Freq: Once | ORAL | Status: AC | PRN
  Administered 2023-11-20: 5 mg via ORAL

## 2023-11-20 MED ORDER — MEPERIDINE HCL 25 MG/ML IJ SOLN: 6.2500 mg | INTRAMUSCULAR | Status: DC | PRN

## 2023-11-20 MED ORDER — INDOCYANINE GREEN 25 MG IV SOLR
2.5000 mg | Freq: Once | INTRAVENOUS | Status: AC
  Administered 2023-11-20: 2.5 mg via INTRAVENOUS
  Filled 2023-11-20: qty 10

## 2023-11-20 MED ORDER — LIDOCAINE 2% (20 MG/ML) 5 ML SYRINGE
INTRAMUSCULAR | Status: AC
  Filled 2023-11-20: qty 5

## 2023-11-20 MED ORDER — ONDANSETRON HCL 4 MG/2ML IJ SOLN
INTRAMUSCULAR | Status: DC | PRN
  Administered 2023-11-20: 4 mg via INTRAVENOUS

## 2023-11-20 MED ORDER — SODIUM CHLORIDE 0.9 % IR SOLN
Status: DC | PRN
  Administered 2023-11-20: 1000 mL

## 2023-11-20 MED ORDER — ORAL CARE MOUTH RINSE: 15.0000 mL | Freq: Once | OROMUCOSAL | Status: AC

## 2023-11-20 MED ORDER — MIDAZOLAM HCL 2 MG/2ML IJ SOLN
INTRAMUSCULAR | Status: AC
  Filled 2023-11-20: qty 2

## 2023-11-20 MED ORDER — FENTANYL CITRATE (PF) 250 MCG/5ML IJ SOLN
INTRAMUSCULAR | Status: DC | PRN
  Administered 2023-11-20 (×2): 25 ug via INTRAVENOUS
  Administered 2023-11-20 (×2): 50 ug via INTRAVENOUS
  Administered 2023-11-20: 100 ug via INTRAVENOUS

## 2023-11-20 MED ORDER — OXYCODONE HCL 5 MG/5ML PO SOLN
5.0000 mg | Freq: Once | ORAL | Status: AC | PRN
Start: 2023-11-20 — End: 2023-11-20

## 2023-11-20 MED ORDER — ROCURONIUM BROMIDE 10 MG/ML (PF) SYRINGE
PREFILLED_SYRINGE | INTRAVENOUS | Status: AC
  Filled 2023-11-20: qty 10

## 2023-11-20 MED ORDER — DEXMEDETOMIDINE HCL IN NACL 80 MCG/20ML IV SOLN
INTRAVENOUS | Status: AC
  Filled 2023-11-20: qty 20

## 2023-11-20 MED ORDER — ONDANSETRON HCL 4 MG/2ML IJ SOLN
INTRAMUSCULAR | Status: AC
  Filled 2023-11-20: qty 2

## 2023-11-20 MED ORDER — LACTATED RINGERS IV SOLN: INTRAVENOUS | Status: DC

## 2023-11-20 MED ORDER — BUPIVACAINE-EPINEPHRINE (PF) 0.25% -1:200000 IJ SOLN
INTRAMUSCULAR | Status: AC
  Filled 2023-11-20: qty 30

## 2023-11-20 MED ORDER — FENTANYL CITRATE (PF) 250 MCG/5ML IJ SOLN
INTRAMUSCULAR | Status: AC
  Filled 2023-11-20: qty 5

## 2023-11-20 MED ORDER — CHLORHEXIDINE GLUCONATE 0.12 % MT SOLN
15.0000 mL | Freq: Once | OROMUCOSAL | Status: AC
Start: 2023-11-20 — End: 2023-11-20

## 2023-11-20 MED ORDER — OXYCODONE HCL 5 MG PO TABS
ORAL_TABLET | ORAL | Status: AC
  Filled 2023-11-20: qty 1

## 2023-11-20 SURGICAL SUPPLY — 35 items
APPLIER CLIP 5 13 M/L LIGAMAX5 (MISCELLANEOUS) ×1 IMPLANT
BAG COUNTER SPONGE SURGICOUNT (BAG) ×1 IMPLANT
BLADE CLIPPER SURG (BLADE) IMPLANT
CANISTER SUCT 3000ML PPV (MISCELLANEOUS) ×1 IMPLANT
CHLORAPREP W/TINT 26 (MISCELLANEOUS) ×1 IMPLANT
CLIP APPLIE 5 13 M/L LIGAMAX5 (MISCELLANEOUS) ×1 IMPLANT
COVER SURGICAL LIGHT HANDLE (MISCELLANEOUS) ×1 IMPLANT
DERMABOND ADVANCED .7 DNX12 (GAUZE/BANDAGES/DRESSINGS) ×1 IMPLANT
DERMABOND ADVANCED .7 DNX6 (GAUZE/BANDAGES/DRESSINGS) IMPLANT
DISSECTOR BLUNT TIP ENDO 5MM (MISCELLANEOUS) IMPLANT
ELECT REM PT RETURN 9FT ADLT (ELECTROSURGICAL) ×1 IMPLANT
ELECTRODE REM PT RTRN 9FT ADLT (ELECTROSURGICAL) ×1 IMPLANT
GLOVE BIO SURGEON STRL SZ7.5 (GLOVE) ×1 IMPLANT
GLOVE INDICATOR 8.0 STRL GRN (GLOVE) ×1 IMPLANT
GOWN STRL REUS W/ TWL LRG LVL3 (GOWN DISPOSABLE) ×2 IMPLANT
GOWN STRL REUS W/ TWL XL LVL3 (GOWN DISPOSABLE) ×1 IMPLANT
IRRIG SUCT STRYKERFLOW 2 WTIP (MISCELLANEOUS) ×1 IMPLANT
IRRIGATION SUCT STRKRFLW 2 WTP (MISCELLANEOUS) ×1 IMPLANT
KIT BASIN OR (CUSTOM PROCEDURE TRAY) ×1 IMPLANT
KIT IMAGING PINPOINTPAQ (MISCELLANEOUS) IMPLANT
KIT TURNOVER KIT B (KITS) ×1 IMPLANT
NS IRRIG 1000ML POUR BTL (IV SOLUTION) ×1 IMPLANT
PAD ARMBOARD 7.5X6 YLW CONV (MISCELLANEOUS) ×1 IMPLANT
PENCIL BUTTON HOLSTER BLD 10FT (ELECTRODE) ×1 IMPLANT
SCISSORS LAP 5X35 DISP (ENDOMECHANICALS) ×1 IMPLANT
SET TUBE SMOKE EVAC HIGH FLOW (TUBING) ×1 IMPLANT
SUT MNCRL AB 4-0 PS2 18 (SUTURE) ×1 IMPLANT
SYS BAG RETRIEVAL 10MM (BASKET) IMPLANT
SYSTEM BAG RETRIEVAL 10MM (BASKET) IMPLANT
TOWEL GREEN STERILE FF (TOWEL DISPOSABLE) ×1 IMPLANT
TRAY LAPAROSCOPIC MC (CUSTOM PROCEDURE TRAY) ×1 IMPLANT
TROCAR ADV FIXATION 5X100MM (TROCAR) ×3 IMPLANT
TROCAR BALLN 12MMX100 BLUNT (TROCAR) ×1 IMPLANT
WARMER LAPAROSCOPE (MISCELLANEOUS) ×1 IMPLANT
WATER STERILE IRR 1000ML POUR (IV SOLUTION) ×1 IMPLANT

## 2023-11-20 NOTE — Plan of Care (Signed)

## 2023-11-20 NOTE — Anesthesia Procedure Notes (Signed)
 Procedure Name: Intubation Date/Time: 11/20/2023 9:49 AM  Performed by: Harrold Macintosh, CRNAPre-anesthesia Checklist: Patient identified, Emergency Drugs available, Suction available and Patient being monitored Patient Re-evaluated:Patient Re-evaluated prior to induction Oxygen Delivery Method: Circle system utilized Preoxygenation: Pre-oxygenation with 100% oxygen Induction Type: IV induction Ventilation: Mask ventilation without difficulty Laryngoscope Size: Miller and 3 Grade View: Grade I Tube type: Oral Tube size: 7.5 mm Number of attempts: 1 Airway Equipment and Method: Stylet and Oral airway Placement Confirmation: ETT inserted through vocal cords under direct vision, positive ETCO2 and breath sounds checked- equal and bilateral Secured at: 23 cm Tube secured with: Tape Dental Injury: Teeth and Oropharynx as per pre-operative assessment
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
## 2023-11-20 NOTE — Anesthesia Preprocedure Evaluation (Signed)
 Anesthesia Evaluation  Patient identified by MRN, date of birth, ID band Patient awake    Reviewed: Allergy & Precautions, NPO status , Patient's Chart, lab work & pertinent test results  History of Anesthesia Complications Negative for: history of anesthetic complications  Airway Mallampati: III  TM Distance: >3 FB Neck ROM: Full    Dental  (+) Dental Advisory Given   Pulmonary neg pulmonary ROS   Pulmonary exam normal breath sounds clear to auscultation       Cardiovascular negative cardio ROS  Rhythm:Regular Rate:Normal     Neuro/Psych negative neurological ROS     GI/Hepatic Neg liver ROS,,,choledocholithiasis   Endo/Other  negative endocrine ROS    Renal/GU negative Renal ROS     Musculoskeletal   Abdominal   Peds  Hematology negative hematology ROS (+) Lab Results      Component                Value               Date                      WBC                      10.5                11/20/2023                HGB                      14.2                11/20/2023                HCT                      39.7                11/20/2023                MCV                      86.9                11/20/2023                PLT                      206                 11/20/2023              Anesthesia Other Findings   Reproductive/Obstetrics                             Anesthesia Physical Anesthesia Plan  ASA: 1  Anesthesia Plan: General   Post-op Pain Management:    Induction: Intravenous  PONV Risk Score and Plan: 2 and Ondansetron , Dexamethasone  and Treatment may vary due to age or medical condition  Airway Management Planned: Oral ETT  Additional Equipment:   Intra-op Plan:   Post-operative Plan: Extubation in OR  Informed Consent: I have reviewed the patients History and Physical, chart, labs and discussed the procedure including the risks, benefits and  alternatives for the proposed anesthesia with the patient or authorized representative who has  indicated his/her understanding and acceptance.     Dental advisory given  Plan Discussed with: Anesthesiologist and CRNA  Anesthesia Plan Comments: (Risks of general anesthesia discussed including, but not limited to, sore throat, hoarse voice, chipped/damaged teeth, injury to vocal cords, nausea and vomiting, allergic reactions, lung infection, heart attack, stroke, and death. All questions answered. )       Anesthesia Quick Evaluation
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
## 2023-11-20 NOTE — Transfer of Care (Signed)
 Immediate Anesthesia Transfer of Care Note  Patient: Jay Bush  Procedure(s) Performed: LAPAROSCOPIC CHOLECYSTECTOMY INDOCYANINE GREEN  FLUORESCENCE IMAGING (ICG)  Patient Location: PACU  Anesthesia Type:General  Level of Consciousness: awake, alert , and oriented  Airway & Oxygen Therapy: Patient Spontanous Breathing and Patient connected to nasal cannula oxygen  Post-op Assessment: Report given to RN, Post -op Vital signs reviewed and stable, Patient moving all extremities X 4, and Patient able to stick tongue midline  Post vital signs: Reviewed and stable  Last Vitals:  Vitals Value Taken Time  BP 100/82   Temp 98.6   Pulse 75 11/20/23 1106  Resp 17 11/20/23 1106  SpO2 94 % 11/20/23 1106  Vitals shown include unfiled device data.  Last Pain:  Vitals:   11/20/23 0841  TempSrc:   PainSc: 3       Patients Stated Pain Goal: 0 (11/20/23 0841)  Complications: No notable events documented.
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
## 2023-11-20 NOTE — Anesthesia Postprocedure Evaluation (Signed)
 Anesthesia Post Note  Patient: Jay Bush  Procedure(s) Performed: LAPAROSCOPIC CHOLECYSTECTOMY INDOCYANINE GREEN  FLUORESCENCE IMAGING (ICG)     Patient location during evaluation: PACU Anesthesia Type: General Level of consciousness: awake Pain management: pain level controlled Vital Signs Assessment: post-procedure vital signs reviewed and stable Respiratory status: spontaneous breathing, nonlabored ventilation and respiratory function stable Cardiovascular status: blood pressure returned to baseline and stable Postop Assessment: no apparent nausea or vomiting Anesthetic complications: no   No notable events documented.  Last Vitals:  Vitals:   11/20/23 1204 11/20/23 1722  BP: (!) 131/91 99/63  Pulse: (!) 55 62  Resp: 16 17  Temp: 36.4 C 36.9 C  SpO2: 98%     Last Pain:  Vitals:   11/20/23 2017  TempSrc:   PainSc: 6                  Jay Bush

## 2023-11-21 ENCOUNTER — Encounter (HOSPITAL_COMMUNITY): Payer: Self-pay | Admitting: Surgery

## 2023-11-21 DIAGNOSIS — K8050 Calculus of bile duct without cholangitis or cholecystitis without obstruction: Secondary | ICD-10-CM | POA: Diagnosis not present

## 2023-11-21 LAB — HEPATIC FUNCTION PANEL
ALT: 239 U/L — ABNORMAL HIGH (ref 0–44)
AST: 41 U/L (ref 15–41)
Albumin: 3.5 g/dL (ref 3.5–5.0)
Alkaline Phosphatase: 93 U/L (ref 38–126)
Bilirubin, Direct: 0.3 mg/dL — ABNORMAL HIGH (ref 0.0–0.2)
Indirect Bilirubin: 0.7 mg/dL (ref 0.3–0.9)
Total Bilirubin: 1 mg/dL (ref 0.0–1.2)
Total Protein: 6.6 g/dL (ref 6.5–8.1)

## 2023-11-21 LAB — BASIC METABOLIC PANEL
Anion gap: 11 (ref 5–15)
BUN: 12 mg/dL (ref 6–20)
CO2: 23 mmol/L (ref 22–32)
Calcium: 8.7 mg/dL — ABNORMAL LOW (ref 8.9–10.3)
Chloride: 102 mmol/L (ref 98–111)
Creatinine, Ser: 1.1 mg/dL (ref 0.61–1.24)
GFR, Estimated: >60 mL/min (ref >60)
Glucose, Bld: 104 mg/dL — ABNORMAL HIGH (ref 70–99)
Potassium: 3.5 mmol/L (ref 3.5–5.1)
Sodium: 136 mmol/L (ref 135–145)

## 2023-11-21 LAB — SURGICAL PATHOLOGY

## 2023-11-21 LAB — CBC
HCT: 39.5 % (ref 39.0–52.0)
Hemoglobin: 13.9 g/dL (ref 13.0–17.0)
MCH: 30.9 pg (ref 26.0–34.0)
MCHC: 35.2 g/dL (ref 30.0–36.0)
MCV: 87.8 fL (ref 80.0–100.0)
Platelets: 195 10*3/uL (ref 150–400)
RBC: 4.5 MIL/uL (ref 4.22–5.81)
RDW: 12.8 % (ref 11.5–15.5)
WBC: 12 10*3/uL — ABNORMAL HIGH (ref 4.0–10.5)
nRBC: 0 % (ref 0.0–0.2)

## 2023-11-21 MED ORDER — OXYCODONE HCL 5 MG PO TABS: 5.0000 mg | ORAL_TABLET | ORAL | 0 refills | Status: AC | PRN

## 2023-11-21 NOTE — Progress Notes (Signed)
 RN gave pt DC instructions and the pt stated understanding. IV has been removed and GF at bedside, new medications escribed to the pt's home pharmacy.
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
## 2023-11-21 NOTE — Discharge Instructions (Signed)
POST OP INSTRUCTIONS  DIET: As tolerated. Follow a light bland diet the first 24 hours after arrival home, such as soup, liquids, crackers, etc.  Be sure to include lots of fluids daily.  Avoid fast food or heavy meals as your are more likely to get nauseated.  Eat a low fat the next few days after surgery.  Take your usually prescribed home medications unless otherwise directed.  PAIN CONTROL: Pain is best controlled by a usual combination of three different methods TOGETHER: Ice/Heat Over the counter pain medication Prescription pain medication Most patients will experience some swelling and bruising around the surgical site.  Ice packs or heating pads (30-60 minutes up to 6 times a day) will help. Some people prefer to use ice alone, heat alone, alternating between ice & heat.  Experiment to what works for you.  Swelling and bruising can take several weeks to resolve.   It is helpful to take an over-the-counter pain medication regularly for the first few weeks: Ibuprofen (Motrin/Advil) - 200mg tabs - take 3 tabs (600mg) every 6 hours as needed for pain Acetaminophen (Tylenol) - you may take 650mg every 6 hours as needed. You can take this with motrin as they act differently on the body. If you are taking a narcotic pain medication that has acetaminophen in it, do not take over the counter tylenol at the same time.  Iii. NOTE: You may take both of these medications together - most patients  find it most helpful when alternating between the two (i.e. Ibuprofen at 6am,  tylenol at 9am, ibuprofen at 12pm ...) A  prescription for pain medication should be given to you upon discharge.  Take your pain medication as prescribed if your pain is not adequatly controlled with the over-the-counter pain reliefs mentioned above.  Avoid getting constipated.  Between the surgery and the pain medications, it is common to experience some constipation.  Increasing fluid intake and taking a fiber supplement (such as  Metamucil, Citrucel, FiberCon, MiraLax, etc) 1-2 times a day regularly will usually help prevent this problem from occurring.  A mild laxative (prune juice, Milk of Magnesia, MiraLax, etc) should be taken according to package directions if there are no bowel movements after 48 hours.    Dressing: Your incisions are covered in Dermabond which is like sterile superglue for the skin. This will come off on it's own in a couple weeks. It is waterproof and you may bathe normally starting the day after your surgery in a shower. Avoid baths/pools/lakes/oceans until your wounds have fully healed.  ACTIVITIES as tolerated:   Avoid heavy lifting (>10lbs or 1 gallon of milk) for the next 6 weeks. You may resume regular (light) daily activities beginning the next day--such as daily self-care, walking, climbing stairs--gradually increasing activities as tolerated.  If you can walk 30 minutes without difficulty, it is safe to try more intense activity such as jogging, treadmill, bicycling, low-impact aerobics.  DO NOT PUSH THROUGH PAIN.  Let pain be your guide: If it hurts to do something, don't do it. You may drive when you are no longer taking prescription pain medication, you can comfortably wear a seatbelt, and you can safely maneuver your car and apply brakes.   FOLLOW UP in our office Please call [facility] at [phone] to set up an appointment to see your surgeon in the office for a follow-up appointment approximately 2 weeks after your surgery. Make sure that you call for this appointment the day you arrive home to   insure a convenient appointment time.  9. If you have disability or family leave forms that need to be completed, you may have them completed by your primary care physician's office; for return to work instructions, please ask our office staff and they will be happy to assist you in obtaining this documentation   When to call us [phone]: Poor pain control Reactions / problems with new  medications (rash/itching, etc)  Fever over 101.5 F (38.5 C) Inability to urinate Nausea/vomiting Worsening swelling or bruising Continued bleeding from incision. Increased pain, redness, or drainage from the incision  The clinic staff is available to answer your questions during regular business hours (8:30am-5pm).  Please don't hesitate to call and ask to speak to one of our nurses for clinical concerns.   A surgeon from [facility] is always on call at the hospitals   If you have a medical emergency, go to the nearest emergency room or call 911.  [facility] [address], [address], [place], [place]  [address] MAIN: [phone] FAX: [phone] [email or web address] 

## 2023-11-22 ENCOUNTER — Encounter: Payer: Self-pay | Admitting: Gastroenterology

## 2023-11-22 ENCOUNTER — Encounter (HOSPITAL_COMMUNITY): Payer: Self-pay | Admitting: Gastroenterology
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
